# Patient Record
Sex: Male | Born: 1995 | Race: White | Hispanic: No | Marital: Single | Smoking: Current some day smoker
Health system: Southern US, Community
[De-identification: ages and names within clinical notes are randomized; demographics above are authoritative.]

## PROBLEM LIST (undated history)

## (undated) DIAGNOSIS — F32A Depression, unspecified: Secondary | ICD-10-CM

## (undated) DIAGNOSIS — F329 Major depressive disorder, single episode, unspecified: Secondary | ICD-10-CM

## (undated) HISTORY — PX: NO PAST SURGERIES: SHX2092

---

## 2017-10-29 ENCOUNTER — Ambulatory Visit
Admission: EM | Admit: 2017-10-29 | Discharge: 2017-10-29 | Disposition: A | Discharge status: Home / Self Care | Payer: No Typology Code available for payment source | Attending: Family Medicine | Admitting: Family Medicine

## 2017-10-29 ENCOUNTER — Other Ambulatory Visit: Payer: Self-pay

## 2017-10-29 DIAGNOSIS — Z23 Encounter for immunization: Secondary | ICD-10-CM

## 2017-10-29 DIAGNOSIS — S61011A Laceration without foreign body of right thumb without damage to nail, initial encounter: Secondary | ICD-10-CM

## 2017-10-29 HISTORY — DX: Depression, unspecified: F32.A

## 2017-10-29 HISTORY — DX: Major depressive disorder, single episode, unspecified: F32.9

## 2017-10-29 MED ORDER — TETANUS-DIPHTH-ACELL PERTUSSIS 5-2.5-18.5 LF-MCG/0.5 IM SUSP
0.5000 mL | Freq: Once | INTRAMUSCULAR | Status: AC
  Administered 2017-10-29: 0.5 mL via INTRAMUSCULAR

## 2017-10-29 NOTE — Discharge Instructions (Addendum)
Please soak thumb and half hydrogen peroxide and half tap water twice daily.  Keep covered with antibiotic ointment during the day.  Return to the clinic for any redness drainage.

## 2017-10-29 NOTE — ED Provider Notes (Signed)
[facility]    CSN: [id number] Arrival date & time: 10/29/17  1017     History   Chief Complaint Chief Complaint  Patient presents with  . Hand Injury    HPI Donald Stone is a 22 y.o. male   Presents to the urgent care facility for evaluation of laceration to the right thumb that occurred 3 to 5 days ago.  Patient states he cut his right thumb, ulnar aspect along the webspace with a pipe, he is a Nutritional therapist.  He has been cleaning with hydrogen peroxide and antibiotic ointment.  Denies any pain, swelling, warmth, drainage.  He thought may be that the wound was infected because every time he poured hydrogen peroxide it would continue to bubble.  His tetanus is also not up-to-date and so is here today for tetanus.  HPI  Past Medical History:  Diagnosis Date  . Depression     There are no active problems to display for this patient.   History reviewed. No pertinent surgical history.     Home Medications    Prior to Admission medications   Medication Sig Start Date End Date Taking? Authorizing Provider  FLUoxetine (PROZAC) 20 MG capsule TK ONE C PO D 10/01/17   [provider]    Family History History reviewed. No pertinent family history.  Social History Social History   Tobacco Use  . Smoking status: Never Smoker  . Smokeless tobacco: Never Used  Substance Use Topics  . Alcohol use: Yes    Comment: social  . Drug use: Not Currently     Allergies   Patient has no known allergies.   Review of Systems Review of Systems  Constitutional: Negative for fever.  Musculoskeletal: Negative for arthralgias.  Skin: Positive for wound.     Physical Exam Triage Vital Signs ED Triage Vitals  Enc Vitals Group     BP 10/29/17 1031 116/72     Pulse Rate 10/29/17 1031 60     Resp 10/29/17 1031 18     Temp 10/29/17 1031 97.8 F (36.6 C)     Temp Source 10/29/17 1031 Oral     SpO2 10/29/17 1031 100 %     Weight 10/29/17 1033 140 lb (63.5 kg)     Height 10/29/17 1033  (1.676 m)     Head Circumference --      Peak Flow --      Pain Score 10/29/17 1033 3     Pain Loc --      Pain Edu? --      Excl. in GC? --    No data found.  Updated Vital Signs BP 116/72 (BP Location: Left Arm)   Pulse 60   Temp 97.8 F (36.6 C) (Oral)   Resp 18   Ht  (1.676 m)   Wt 140 lb (63.5 kg)   SpO2 100%   BMI 22.60 kg/m   Visual Acuity Right Eye Distance:   Left Eye Distance:   Bilateral Distance:    Right Eye Near:   Left Eye Near:    Bilateral Near:     Physical Exam  Constitutional: He appears well-developed and well-nourished.  HENT:  Head: Normocephalic and atraumatic.  Eyes: Conjunctivae are normal.  Cardiovascular: Normal rate.  Pulmonary/Chest: Effort normal. No respiratory distress.  Musculoskeletal: Normal range of motion.  Patient has no bony tenderness palpation along the right thumb.  Is full composite fist with no evidence of tendon deficit.  He has evidence  of 3 x 2 cm area of soft tissue avulsion along the webspace of the right thumb of the right hand.  Skin is healing with good granulation tissue.  Wound is dry with no surrounding erythema or warmth.  There is no fluctuance or induration.     UC Treatments / Results  Labs (all labs ordered are listed, but only abnormal results are displayed) Labs Reviewed - No data to display  EKG None  Radiology No results found.  Procedures Procedures (including critical care time)  Medications Ordered in UC Medications  Tdap (BOOSTRIX) injection 0.5 mL (0.5 mLs Intramuscular Given 10/29/17 1038)    Initial Impression / Assessment and Plan / UC Course  I have reviewed the triage vital signs and the nursing notes.  Pertinent labs & imaging results that were available during my care of the patient were reviewed by me and considered in my medical decision making (see chart for details).     22 year old male with injury to the right thumb 3 to 5 days ago,  mostly small laceration with soft tissue avulsion.  Patient allow this to heal with secondary intention.  Wound is almost completely healed.  No signs of infection.  Tetanus was updated today.  Patient will continue with hydrogen peroxide but will dilute with normal saline.  He will continue with antibiotic ointment.  He is educated on signs symptoms return to the ED for. Final Clinical Impressions(s) / UC Diagnoses   Final diagnoses:  Laceration of right thumb without foreign body without damage to nail, initial encounter     Discharge Instructions     Please soak thumb and half hydrogen peroxide and half tap water twice daily.  Keep covered with antibiotic ointment during the day.  Return to the clinic for any redness drainage.   ED Prescriptions    None       Evon Slack, [place] 10/29/17 1146

## 2017-10-29 NOTE — ED Triage Notes (Signed)
Pt reports he cut his right thumb a week ago on a piece of pipe. Pain 3/10

## 2018-07-09 ENCOUNTER — Ambulatory Visit
Admission: EM | Admit: 2018-07-09 | Discharge: 2018-07-09 | Disposition: A | Discharge status: Home / Self Care | Payer: No Typology Code available for payment source | Attending: Family Medicine | Admitting: Family Medicine

## 2018-07-09 ENCOUNTER — Other Ambulatory Visit: Payer: Self-pay

## 2018-07-09 DIAGNOSIS — R112 Nausea with vomiting, unspecified: Secondary | ICD-10-CM | POA: Insufficient documentation

## 2018-07-09 DIAGNOSIS — J111 Influenza due to unidentified influenza virus with other respiratory manifestations: Secondary | ICD-10-CM

## 2018-07-09 DIAGNOSIS — R69 Illness, unspecified: Secondary | ICD-10-CM | POA: Insufficient documentation

## 2018-07-09 LAB — RAPID INFLUENZA A&B ANTIGENS: Influenza B (ARMC): NEGATIVE

## 2018-07-09 LAB — RAPID STREP SCREEN (MED CTR MEBANE ONLY): Streptococcus, Group A Screen (Direct): NEGATIVE

## 2018-07-09 LAB — RAPID INFLUENZA A&B ANTIGENS (ARMC ONLY): INFLUENZA A (ARMC): NEGATIVE

## 2018-07-09 MED ORDER — ONDANSETRON 4 MG PO TBDP: 4.0000 mg | ORAL_TABLET | Freq: Three times a day (TID) | ORAL | 0 refills | Status: AC | PRN

## 2018-07-09 MED ORDER — ONDANSETRON 8 MG PO TBDP
8.0000 mg | ORAL_TABLET | Freq: Once | ORAL | Status: AC
  Administered 2018-07-09: 8 mg via ORAL

## 2018-07-09 NOTE — ED Triage Notes (Signed)
Patient complains of a fever that started on Thursday and Friday that went away on Sunday. States that this morning he started vomiting and that nausea has been intense. Denies Diarrhea.

## 2018-07-09 NOTE — Discharge Instructions (Signed)
Take medication as prescribed. Rest. Drink plenty of fluids.  Over-the-counter cough and congestion medications as needed.  Follow up with your primary care physician this week as needed. Return to Urgent care for new or worsening concerns.

## 2018-07-09 NOTE — ED Provider Notes (Signed)
[facility] ____________________________________________  Time seen: Approximately 2:39 PM  I have reviewed the triage vital signs and the nursing notes.   HISTORY  Chief Complaint Emesis   HPI Donald Stone is a 23 y.o. male presenting for evaluation of nausea and vomiting.  Patient reports this past Thursday evening he had a quick onset of sore throat, fever, body aches and some cough.  States sore throat has continued, currently moderate.  States Saturday his fever got to the highest of 102.7.  Was taken intermittent over-the-counter DayQuil and NyQuil which helped but would come back.  States after Saturday fever resolved.  States Saturday he did have several episodes of diarrhea that he describes a normal coloration.  States yesterday he started to feel like he was getting better and started resuming activities.  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  States still nauseated currently.  Denies any abnormal color stool or vomit, no blood in stool or vomit.  Reports no home sick contacts.  Denies abnormal foods or known food trigger.  Denies abdominal pain, chest pain, shortness of breath.  Reports healthy person.   Past Medical History:  Diagnosis Date  . Depression     There are no active problems to display for this patient.   Past Surgical History:  Procedure Laterality Date  . NO PAST SURGERIES       No current facility-administered medications for this encounter.   Current Outpatient Medications:  .  ondansetron (ZOFRAN ODT) 4 MG disintegrating tablet, Take 1 tablet (4 mg total) by mouth every 8 (eight) hours as needed., Disp: 15 tablet, Rfl: 0  Allergies Sweetening enhancer [sweetness enhancer]  History reviewed. No pertinent family history.  Social History Social History   Tobacco Use  . Smoking status: Current Some Day  Smoker    Types: Cigarettes  . Smokeless tobacco: Never Used  Substance Use Topics  . Alcohol use: Yes    Comment: social  . Drug use: Not Currently    Review of Systems Constitutional: Positive fever Eyes: No visual changes. ENT: Positive sore throat. Cardiovascular: Denies chest pain. Respiratory: Denies shortness of breath. Gastrointestinal: No abdominal pain.  As above Genitourinary: Negative for dysuria. Musculoskeletal: Negative for back pain. Skin: Negative for rash.   ____________________________________________   PHYSICAL EXAM:  VITAL SIGNS: ED Triage Vitals  Enc Vitals Group     BP 07/09/18 1319 117/74     Pulse Rate 07/09/18 1319 82     Resp 07/09/18 1319 18     Temp 07/09/18 1319 98.4 F (36.9 C)     Temp Source 07/09/18 1319 Oral     SpO2 07/09/18 1319 100 %     Weight 07/09/18 1316 145 lb (65.8 kg)     Height 07/09/18 1316 5\' 7"  (1.702 m)     Head Circumference --      Peak Flow --      Pain Score 07/09/18 1316 0     Pain Loc --      Pain Edu? --      Excl. in GC? --     Constitutional: Alert and oriented. Well appearing and in no acute distress. Eyes: Conjunctivae are normal.  Head: Atraumatic. No sinus tenderness to palpation. No swelling. No erythema.  Ears: no erythema, normal TMs bilaterally.   Nose:Nasal congestion with clear rhinorrhea  Mouth/Throat:  Mucous membranes are moist. Mild pharyngeal erythema. No tonsillar swelling or exudate.  Neck: No stridor.  No cervical spine tenderness to palpation. Hematological/Lymphatic/Immunilogical: No cervical lymphadenopathy. Cardiovascular: Normal rate, regular rhythm. Grossly normal heart sounds.  Good peripheral circulation. Respiratory: Normal respiratory effort.  No retractions. No wheezes, rales or rhonchi. Good air movement.  Gastrointestinal: Normal Bowel sounds.  Minimal epigastric tenderness palpation.  Abdomen otherwise soft and nontender.  No CVA tenderness. Musculoskeletal: Ambulatory  with steady gait. No cervical, thoracic or lumbar tenderness to palpation. Neurologic:  Normal speech and language. No gait instability. Skin:  Skin appears warm, dry and intact. No rash noted. Psychiatric: Mood and affect are normal. Speech and behavior are normal. ___________________________________________   LABS (all labs ordered are listed, but only abnormal results are displayed)  Labs Reviewed  RAPID INFLUENZA A&B ANTIGENS ([facility] ONLY)  RAPID STREP SCREEN ([facility] ONLY)  CULTURE, GROUP A STREP [facility])     PROCEDURES Procedures   __________________________________________   INITIAL IMPRESSION / ASSESSMENT AND PLAN / ED COURSE  Pertinent labs & imaging results that were available during my care of the patient were reviewed by me and considered in my medical decision making (see chart for details).  Well-appearing patient.  No acute distress.  Influenza negative.  Strep negative, will culture.  8 milligrams ODT Zofran given once in urgent care, post Zofran, patient reports feeling much better including abdominal discomfort better.  Suspect viral illness.  Encourage rest, fluids, supportive care, PRN Zofran as needed.  Work note given for today and tomorrow.Discussed indication, risks and benefits of medications with patient.  Discussed follow up with Primary care physician this week. Discussed follow up and return parameters including no resolution or any worsening concerns. Patient verbalized understanding and agreed to plan.   ____________________________________________   FINAL CLINICAL IMPRESSION(S) / ED DIAGNOSES  Final diagnoses:  Influenza-like illness  Non-intractable vomiting with nausea, unspecified vomiting type     ED Discharge Orders         Ordered    ondansetron (ZOFRAN ODT) 4 MG disintegrating tablet  Every 8 hours PRN     07/09/18 1440           Note: This dictation was prepared with Dragon dictation along with smaller phrase technology.  Any transcriptional errors that result from this process are unintentional.         Renford Dills, NP 07/09/18 1853

## 2018-07-11 ENCOUNTER — Ambulatory Visit
Admission: EM | Admit: 2018-07-11 | Discharge: 2018-07-11 | Disposition: A | Discharge status: Home / Self Care | Payer: Self-pay | Attending: Family Medicine | Admitting: Family Medicine

## 2018-07-11 ENCOUNTER — Encounter: Payer: Self-pay | Admitting: Emergency Medicine

## 2018-07-11 ENCOUNTER — Other Ambulatory Visit: Payer: Self-pay

## 2018-07-11 ENCOUNTER — Ambulatory Visit (INDEPENDENT_AMBULATORY_CARE_PROVIDER_SITE_OTHER): Payer: Self-pay

## 2018-07-11 DIAGNOSIS — R110 Nausea: Secondary | ICD-10-CM

## 2018-07-11 DIAGNOSIS — B349 Viral infection, unspecified: Secondary | ICD-10-CM | POA: Insufficient documentation

## 2018-07-11 DIAGNOSIS — R05 Cough: Secondary | ICD-10-CM

## 2018-07-11 MED ORDER — PROMETHAZINE HCL 25 MG PO TABS: 25.0000 mg | ORAL_TABLET | Freq: Four times a day (QID) | ORAL | 0 refills | Status: AC | PRN

## 2018-07-11 MED ORDER — BENZONATATE 100 MG PO CAPS: 100.0000 mg | ORAL_CAPSULE | Freq: Three times a day (TID) | ORAL | 0 refills | Status: AC | PRN

## 2018-07-11 NOTE — ED Triage Notes (Signed)
Pt c/o cough, fever, and malaise. He was seen on Monday and states not feeling better. He does not feel he can return to work. He states that the cough is keeping him up at night.

## 2018-07-11 NOTE — Discharge Instructions (Signed)
Rest. Fluids.  Zofran as needed.  Phenergan if needed for refractory symptoms.  Take care  Dr. Adriana Simas

## 2018-07-11 NOTE — ED Provider Notes (Signed)
[facility]    CSN: [id number] Arrival date & time: 07/11/18  3825  History   Chief Complaint Chief Complaint  Patient presents with  . Cough   HPI  23 year old male presents with cough, nausea.  Patient recently seen on 1/27.  Diagnosed with influenza-like illness.  Treated with Zofran.  Patient states that he continues to feel poorly.  Still has nausea.  Response to Zofran often returns before he is due for the next dose.  He reports productive cough.  Fevers have resolved.  Continues to just feel poorly.  Feels that he is not ready to return to work.  No known exacerbating factors.  He does report some associated abdominal pain regarding his nausea.  He is able to drink.  No other associated symptoms.  No other complaints at this time.  PMH, Surgical Hx, Family Hx, Social History reviewed and updated as below.  Past Medical History:  Diagnosis Date  . Depression    Past Surgical History:  Procedure Laterality Date  . NO PAST SURGERIES     Home Medications    Prior to Admission medications   Medication Sig Start Date End Date Taking? Authorizing Provider  ondansetron (ZOFRAN ODT) 4 MG disintegrating tablet Take 1 tablet (4 mg total) by mouth every 8 (eight) hours as needed. 07/09/18  Yes Renford Dills, NP  benzonatate (TESSALON) 100 MG capsule Take 1 capsule (100 mg total) by mouth 3 (three) times daily as needed. 07/11/18   Tommie Sams, DO  promethazine (PHENERGAN) 25 MG tablet Take 1 tablet (25 mg total) by mouth every 6 (six) hours as needed for refractory nausea / vomiting. 07/11/18   Tommie Sams, DO   Social History Social History   Tobacco Use  . Smoking status: Current Some Day Smoker    Types: Cigarettes  . Smokeless tobacco: Never Used  Substance Use Topics  . Alcohol use: Yes    Comment: social  . Drug use: Not Currently     Allergies   Sweetening enhancer [sweetness enhancer]   Review of Systems Review of Systems  Constitutional:  Negative for fever.  Respiratory: Positive for cough.   Gastrointestinal: Positive for abdominal pain and nausea.   Physical Exam Triage Vital Signs ED Triage Vitals  Enc Vitals Group     BP 07/11/18 0944 120/83     Pulse Rate 07/11/18 0944 82     Resp 07/11/18 0944 18     Temp 07/11/18 0944 98.4 F (36.9 C)     Temp Source 07/11/18 0944 Oral     SpO2 07/11/18 0944 100 %     Weight 07/11/18 0942 145 lb 1 oz (65.8 kg)     Height --      Head Circumference --      Peak Flow --      Pain Score 07/11/18 0942 6     Pain Loc --      Pain Edu? --      Excl. in GC? --    Updated Vital Signs BP 120/83 (BP Location: Left Arm)   Pulse 82   Temp 98.4 F (36.9 C) (Oral)   Resp 18   Wt 65.8 kg   SpO2 100%   BMI 22.72 kg/m   Visual Acuity Right Eye Distance:   Left Eye Distance:   Bilateral Distance:    Right Eye Near:   Left Eye Near:    Bilateral Near:     Physical Exam Vitals signs and nursing  note reviewed.  Constitutional:      General: He is not in acute distress.    Comments: Appears fatigued but in NAD.  HENT:     Head: Normocephalic and atraumatic.     Right Ear: Tympanic membrane normal.     Left Ear: Tympanic membrane normal.     Nose: Nose normal.     Mouth/Throat:     Mouth: Mucous membranes are moist.     Pharynx: Oropharynx is clear. No posterior oropharyngeal erythema.  Eyes:     Conjunctiva/sclera: Conjunctivae normal.  Cardiovascular:     Rate and Rhythm: Normal rate and regular rhythm.  Pulmonary:     Effort: Pulmonary effort is normal.     Breath sounds: Normal breath sounds.  Abdominal:     General: There is no distension.     Palpations: Abdomen is soft.     Tenderness: There is no abdominal tenderness. There is no guarding or rebound.  Neurological:     Mental Status: He is alert.  Psychiatric:        Behavior: Behavior normal.     Comments: Flat affect.    UC Treatments / Results  Labs (all labs ordered are listed, but only  abnormal results are displayed) Labs Reviewed - No data to display  EKG None  Radiology Dg Chest 2 View  Result Date: 07/11/2018 CLINICAL DATA:  Productive cough. EXAM: CHEST - 2 VIEW COMPARISON:  No prior. FINDINGS: Mediastinum and hilar structures normal. Lungs are clear. No pleural effusion or pneumothorax. Heart size normal. No acute bony abnormality. IMPRESSION: No acute cardiopulmonary disease. Electronically Signed   By: Maisie Fus  Register   On: 07/11/2018 10:31    Procedures Procedures (including critical care time)  Medications Ordered in UC Medications - No data to display  Initial Impression / Assessment and Plan / UC Course  I have reviewed the triage vital signs and the nursing notes.  Pertinent labs & imaging results that were available during my care of the patient were reviewed by me and considered in my medical decision making (see chart for details).    23 year old male presents with a viral illness.  Chest x-ray negative today.  Advised rest and fluids.  Phenergan given to be used for refractory nausea.  Tessalon Perles for cough.  Supportive care.  Work note given.  Final Clinical Impressions(s) / UC Diagnoses   Final diagnoses:  Viral illness     Discharge Instructions     Rest. Fluids.  Zofran as needed.  Phenergan if needed for refractory symptoms.  Take care  Dr. Adriana Simas    ED Prescriptions    Medication Sig Dispense Auth. Provider   promethazine (PHENERGAN) 25 MG tablet Take 1 tablet (25 mg total) by mouth every 6 (six) hours as needed for refractory nausea / vomiting. 30 tablet Brayam Boeke G, DO   benzonatate (TESSALON) 100 MG capsule Take 1 capsule (100 mg total) by mouth 3 (three) times daily as needed. 30 capsule Tommie Sams, DO     Controlled Substance Prescriptions [place] Controlled Substance Registry consulted? Not Applicable   Tommie Sams, DO 07/11/18 1051

## 2018-07-12 LAB — CULTURE, GROUP A STREP (THRC)

## 2018-09-11 ENCOUNTER — Encounter: Payer: Self-pay | Admitting: Emergency Medicine

## 2018-09-11 ENCOUNTER — Other Ambulatory Visit: Payer: Self-pay

## 2018-09-11 ENCOUNTER — Ambulatory Visit
Admission: EM | Admit: 2018-09-11 | Discharge: 2018-09-11 | Disposition: A | Discharge status: Home / Self Care | Payer: 59 | Attending: Urgent Care | Admitting: Urgent Care

## 2018-09-11 DIAGNOSIS — R112 Nausea with vomiting, unspecified: Secondary | ICD-10-CM | POA: Diagnosis not present

## 2018-09-11 NOTE — ED Provider Notes (Signed)
[facility] [address] [place], [place] [address] [phone]   Time seen: Approximately 11:10 AM  I have reviewed the triage vital signs and the nursing notes.   HISTORY  Chief Complaint Emesis   HPI Donald Stone is a 23 y.o. male patient presents with complaints of vomiting x 2 days ago. Last episode of vomiting was on Monday at around 0800. No fevers or respiratory symptoms. Patient states, "I am fine. My boss just wont let me come back without a note". Patient denies any other acute symptoms today.   Past Medical History:  Diagnosis Date   Depression     There are no active problems to display for this patient.   Past Surgical History:  Procedure Laterality Date   NO PAST SURGERIES       No current facility-administered medications for this encounter.   Current Outpatient Medications:    benzonatate (TESSALON) 100 MG capsule, Take 1 capsule (100 mg total) by mouth 3 (three) times daily as needed., Disp: 30 capsule, Rfl: 0   ondansetron (ZOFRAN ODT) 4 MG disintegrating tablet, Take 1 tablet (4 mg total) by mouth every 8 (eight) hours as needed., Disp: 15 tablet, Rfl: 0   promethazine (PHENERGAN) 25 MG tablet, Take 1 tablet (25 mg total) by mouth every 6 (six) hours as needed for refractory nausea / vomiting., Disp: 30 tablet, Rfl: 0  Allergies Sweetening enhancer [sweetness enhancer]  Family history History reviewed. No pertinent family history.  Social history Social History   Tobacco Use   Smoking status: Current Some Day Smoker    Types: Cigarettes   Smokeless tobacco: Never Used  Substance Use Topics   Alcohol use: Yes    Comment: social   Drug use: Not Currently    Review of Systems Review of Systems  Constitutional: Negative for diaphoresis, fever, malaise/fatigue and weight loss.  HENT: Negative.   Eyes: Negative.   Respiratory: Negative for cough, hemoptysis, sputum production  and shortness of breath.   Cardiovascular: Negative for chest pain, palpitations, orthopnea, leg swelling and PND.  Gastrointestinal: Positive for nausea and vomiting. Negative for abdominal pain, blood in stool, constipation, diarrhea and melena.  Genitourinary: Negative for dysuria, frequency, hematuria and urgency.  Musculoskeletal: Negative for back pain, falls, joint pain and myalgias.  Skin: Negative.  Negative for itching and rash.  Neurological: Negative for dizziness, tremors, weakness and headaches.  Endo/Heme/Allergies: Does not bruise/bleed easily.  Psychiatric/Behavioral: Negative for depression, memory loss and suicidal ideas. The patient is not nervous/anxious and does not have insomnia.   All other systems reviewed and are negative.  ____________________________________________   PHYSICAL EXAM:  VITAL SIGNS: ED Triage Vitals  Enc Vitals Group     BP 09/11/18 1104 118/76     Pulse Rate 09/11/18 1104 79     Resp 09/11/18 1104 16     Temp 09/11/18 1104 98.3 F (36.8 C)     Temp Source 09/11/18 1104 Oral     SpO2 09/11/18 1104 100 %     Weight 09/11/18 1101 135 lb (61.2 kg)     Height 09/11/18 1101 5\' 6"  (1.676 m)     Head Circumference --      Peak Flow --      Pain Score 09/11/18 1101 0     Pain Loc --      Pain Edu? --      Excl. in GC? --     Physical Exam  Constitutional:  He is oriented to person, place, and time and well-developed, well-nourished, and in no distress.  HENT:  Head: Normocephalic and atraumatic.  Mouth/Throat: Oropharynx is clear and moist and mucous membranes are normal.  Eyes: Pupils are equal, round, and reactive to light. EOM are normal. No scleral icterus.  Neck: Normal range of motion. Neck supple. No tracheal deviation present. No thyromegaly present.  Cardiovascular: Normal rate, regular rhythm, normal heart sounds and intact distal pulses. Exam reveals no gallop and no friction rub.  No murmur heard. Pulmonary/Chest: Effort normal  and breath sounds normal. No respiratory distress. He has no wheezes. He has no rales.  Abdominal: Soft. Bowel sounds are normal. He exhibits no distension. There is no abdominal tenderness.  Musculoskeletal: Normal range of motion.  Neurological: He is alert and oriented to person, place, and time.  Skin: Skin is warm and dry.  Nursing note and vitals reviewed.  ____________________________________________   INITIAL IMPRESSION / ASSESSMENT AND PLAN / URGENT CARE COURSE  Pertinent labs & imaging results that were available during my care of the patient were reviewed by me and considered in my medical decision making (see chart for details).   Donald Stone is a 23 y.o. male patient presents with complaints of vomiting that began 2 days ago. Last emesis episode was on Monday morning at 0800. He denies any fever, abdominal pain, or changes to his bowel habits. Employer sent him here for further evaluation and clearance to RTW.  Patient is asymptomatic today in clinic. He feels generally well. Emesis episodes were isolated and have since resolved. Patient provided with documentation to RTW as of today.   Discussed follow up with primary care physician this week for re-evaluation. Discussed follow up and return precautions for any new or worsening symptoms. Patient verbalized understanding and consent with the discharge plan as reviewed. All questions were answered prior to patient discharge.    ____________________________________________  FINAL CLINICAL IMPRESSION(S) / URGENT CARE DIAGNOSES  Final diagnoses:  Non-intractable vomiting with nausea, unspecified vomiting type    ED Discharge Orders    None      Note: This dictation was prepared with Dragon dictation along with smaller phrase technology. Any transcriptional errors that result from this process are unintentional.        Verlee Monte, NP 09/11/18 1121

## 2018-09-11 NOTE — Discharge Instructions (Addendum)
Your symptoms have resolved. Continue to increase your fluid intake. Follow up with you PCP for any continued concerns.

## 2018-09-11 NOTE — ED Triage Notes (Signed)
Pt states that he had vomiting 2 days ago and has had none since. His employer will not let him go back to work until he is seen by a provider and has a note.

## 2019-02-14 IMAGING — CR DG CHEST 2V
2 series · 2 of 2 positions shown · non-contrast
Comparison: No prior.

CLINICAL DATA: Productive cough.

EXAM:
CHEST - 2 VIEW

[chest pa]
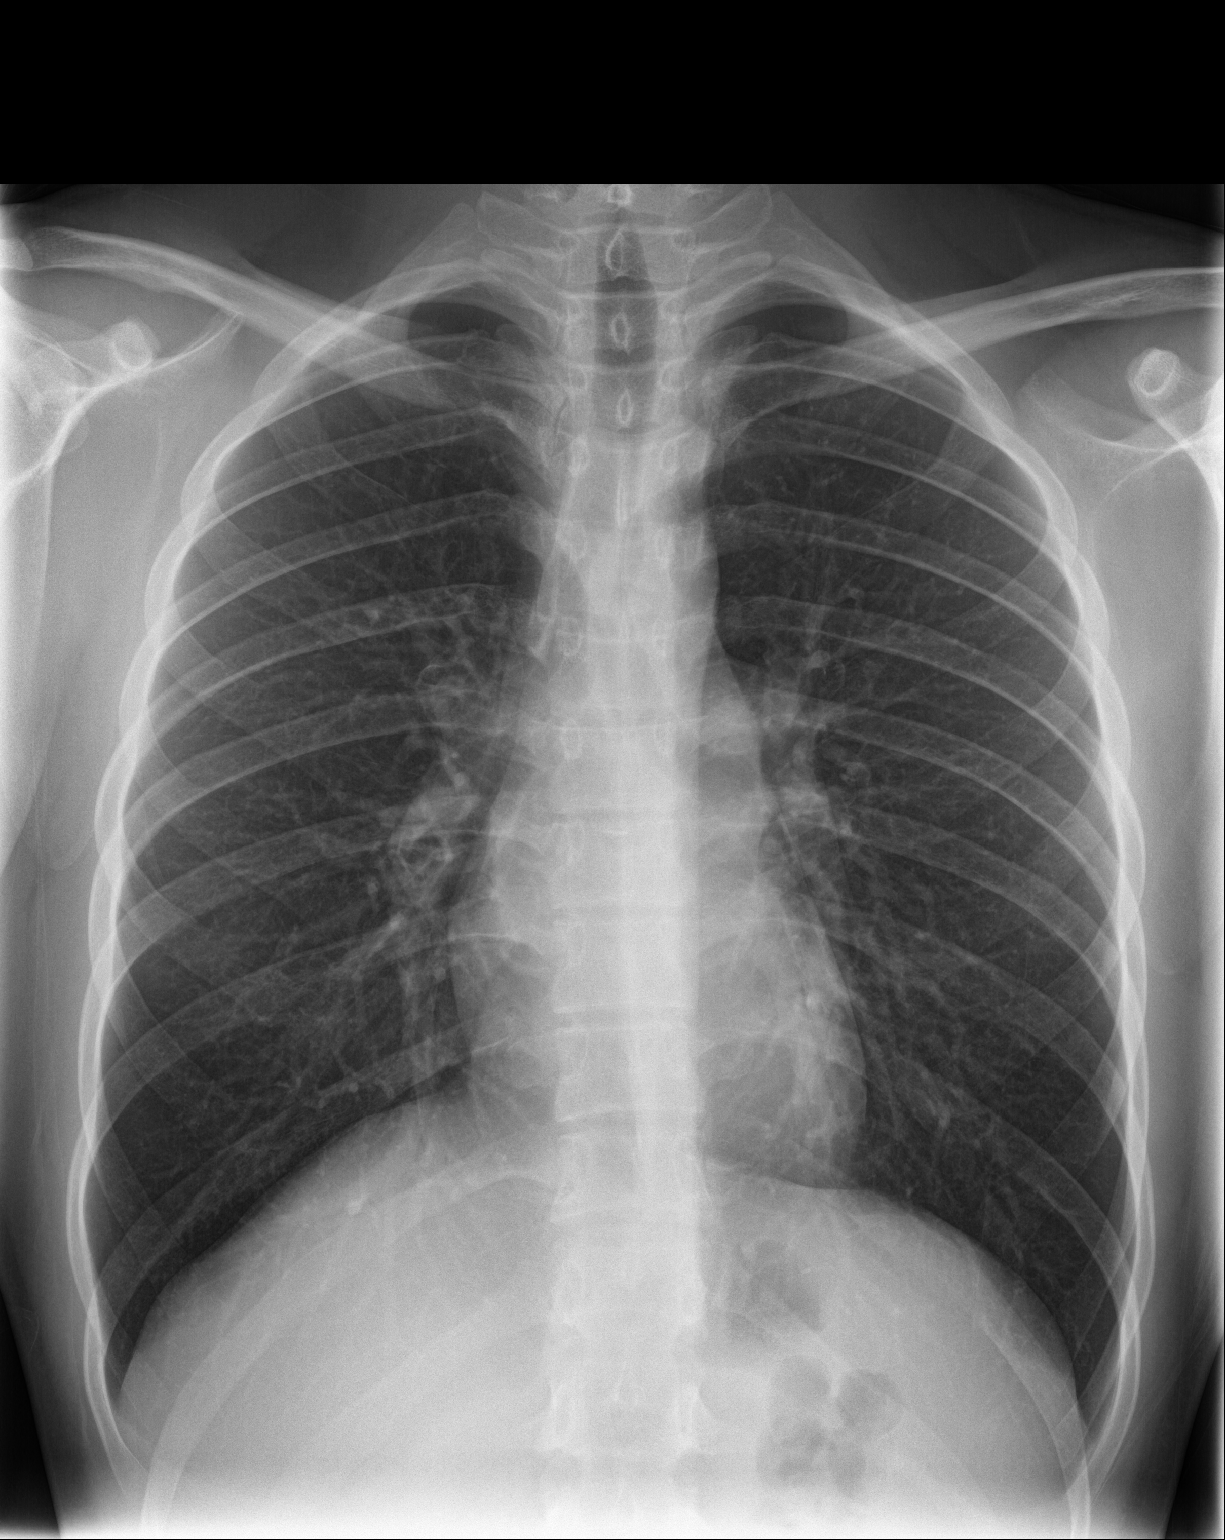

[chest lat]
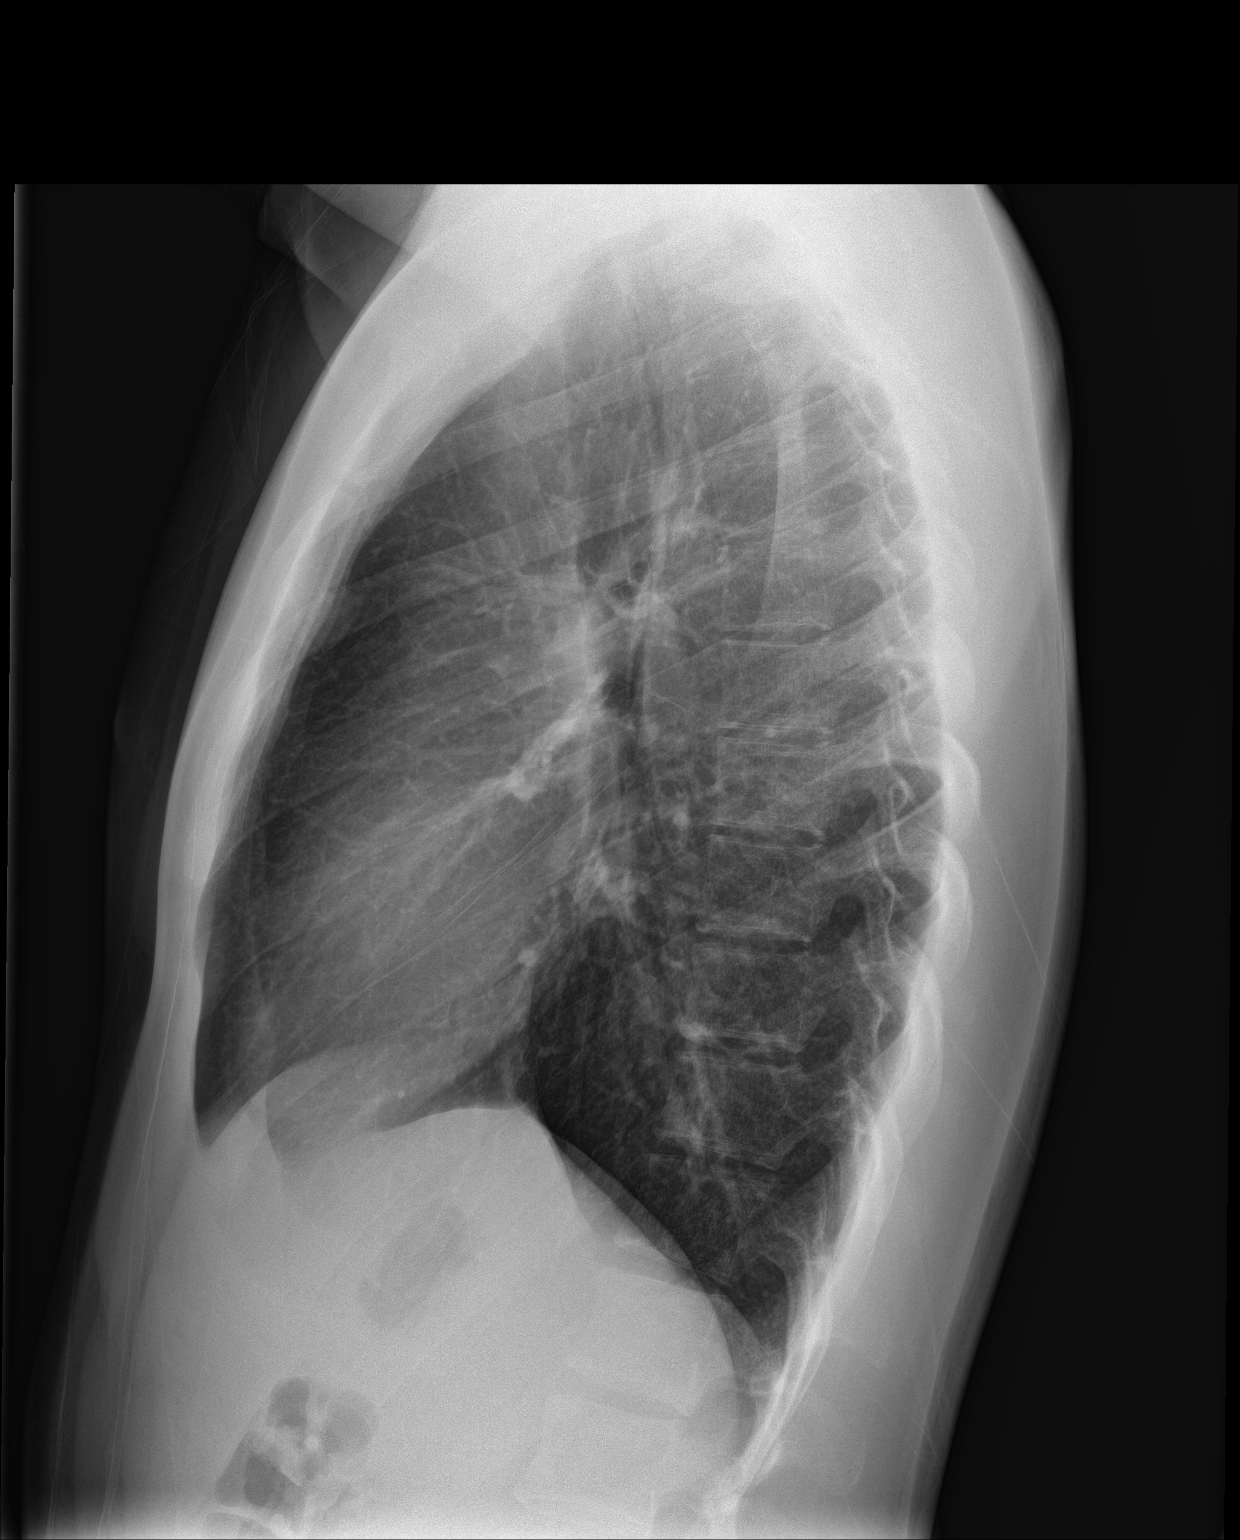

[2 of 2 positions shown; findings below may reference images not displayed]

FINDINGS: Mediastinum and hilar structures normal. Lungs are clear. No pleural
effusion or pneumothorax. Heart size normal. No acute bony
abnormality.
IMPRESSION: No acute cardiopulmonary disease.

## 2019-04-16 ENCOUNTER — Other Ambulatory Visit: Payer: Self-pay | Admitting: *Deleted

## 2019-04-16 DIAGNOSIS — Z20822 Contact with and (suspected) exposure to covid-19: Secondary | ICD-10-CM

## 2019-04-17 ENCOUNTER — Telehealth: Payer: Self-pay | Admitting: General Practice

## 2019-04-17 ENCOUNTER — Telehealth: Payer: Self-pay

## 2019-04-17 LAB — NOVEL CORONAVIRUS, NAA: SARS-CoV-2, NAA: NOT DETECTED

## 2019-04-17 NOTE — Telephone Encounter (Signed)
Negative COVID results given. Patient results "NOT Detected." Caller expressed understanding. ° °

## 2019-04-17 NOTE — Telephone Encounter (Signed)
Received call from patient checking Covid results.  Advised no results at this time.
